# Patient Record
Sex: Female | Born: 1950 | Race: White | Hispanic: No | Marital: Married | Smoking: Current every day smoker
Health system: Southern US, Community
[De-identification: ages and names within clinical notes are randomized; demographics above are authoritative.]

## PROBLEM LIST (undated history)

## (undated) DIAGNOSIS — E079 Disorder of thyroid, unspecified: Secondary | ICD-10-CM

## (undated) DIAGNOSIS — E669 Obesity, unspecified: Secondary | ICD-10-CM

## (undated) DIAGNOSIS — O039 Complete or unspecified spontaneous abortion without complication: Secondary | ICD-10-CM

## (undated) DIAGNOSIS — Z1231 Encounter for screening mammogram for malignant neoplasm of breast: Secondary | ICD-10-CM

## (undated) DIAGNOSIS — H269 Unspecified cataract: Secondary | ICD-10-CM

## (undated) DIAGNOSIS — Z8541 Personal history of malignant neoplasm of cervix uteri: Secondary | ICD-10-CM

## (undated) DIAGNOSIS — Z720 Tobacco use: Secondary | ICD-10-CM

## (undated) DIAGNOSIS — Z8701 Personal history of pneumonia (recurrent): Secondary | ICD-10-CM

## (undated) DIAGNOSIS — C801 Malignant (primary) neoplasm, unspecified: Secondary | ICD-10-CM

## (undated) DIAGNOSIS — Z2820 Immunization not carried out because of patient decision for unspecified reason: Secondary | ICD-10-CM

## (undated) DIAGNOSIS — Z01419 Encounter for gynecological examination (general) (routine) without abnormal findings: Secondary | ICD-10-CM

## (undated) DIAGNOSIS — I8393 Asymptomatic varicose veins of bilateral lower extremities: Secondary | ICD-10-CM

## (undated) DIAGNOSIS — Z973 Presence of spectacles and contact lenses: Secondary | ICD-10-CM

## (undated) HISTORY — DX: Unspecified cataract: H26.9

## (undated) HISTORY — PX: WISDOM TOOTH EXTRACTION: SHX21

## (undated) HISTORY — PX: COLONOSCOPY: SHX174

## (undated) HISTORY — DX: Tobacco use: Z72.0

## (undated) HISTORY — DX: Personal history of malignant neoplasm of cervix uteri: Z85.41

## (undated) HISTORY — PX: SALPINGOSTOMY: SHX2372

## (undated) HISTORY — DX: Immunization not carried out because of patient decision for unspecified reason: Z28.20

## (undated) HISTORY — DX: Malignant (primary) neoplasm, unspecified: C80.1

## (undated) HISTORY — DX: Encounter for gynecological examination (general) (routine) without abnormal findings: Z01.419

## (undated) HISTORY — DX: Disorder of thyroid, unspecified: E07.9

## (undated) HISTORY — DX: Obesity, unspecified: E66.9

## (undated) HISTORY — DX: Complete or unspecified spontaneous abortion without complication: O03.9

## (undated) HISTORY — DX: Encounter for screening mammogram for malignant neoplasm of breast: Z12.31

## (undated) HISTORY — DX: Asymptomatic varicose veins of bilateral lower extremities: I83.93

## (undated) HISTORY — DX: Presence of spectacles and contact lenses: Z97.3

## (undated) HISTORY — DX: Personal history of pneumonia (recurrent): Z87.01

---

## 1982-06-13 DIAGNOSIS — Z8541 Personal history of malignant neoplasm of cervix uteri: Secondary | ICD-10-CM

## 1982-06-13 HISTORY — PX: COLPOSCOPY: SHX161

## 1982-06-13 HISTORY — DX: Personal history of malignant neoplasm of cervix uteri: Z85.41

## 2005-04-18 ENCOUNTER — Other Ambulatory Visit: Admission: RE | Admit: 2005-04-18 | Discharge: 2005-04-18 | Payer: Self-pay | Admitting: Family Medicine

## 2005-04-18 ENCOUNTER — Ambulatory Visit: Payer: Self-pay | Admitting: Family Medicine

## 2005-05-04 ENCOUNTER — Ambulatory Visit: Payer: Self-pay | Admitting: Family Medicine

## 2005-05-20 ENCOUNTER — Ambulatory Visit: Payer: Self-pay | Admitting: Family Medicine

## 2005-05-23 ENCOUNTER — Ambulatory Visit: Payer: Self-pay | Admitting: Family Medicine

## 2013-06-13 DIAGNOSIS — Z2820 Immunization not carried out because of patient decision for unspecified reason: Secondary | ICD-10-CM

## 2013-06-13 HISTORY — DX: Immunization not carried out because of patient decision for unspecified reason: Z28.20

## 2013-07-14 DIAGNOSIS — Z1231 Encounter for screening mammogram for malignant neoplasm of breast: Secondary | ICD-10-CM

## 2013-07-14 HISTORY — DX: Encounter for screening mammogram for malignant neoplasm of breast: Z12.31

## 2013-07-23 ENCOUNTER — Other Ambulatory Visit: Payer: Self-pay

## 2013-07-23 ENCOUNTER — Ambulatory Visit (INDEPENDENT_AMBULATORY_CARE_PROVIDER_SITE_OTHER): Payer: Federal, State, Local not specified - PPO | Admitting: Medical

## 2013-07-23 ENCOUNTER — Encounter: Payer: Self-pay | Admitting: Medical

## 2013-07-23 VITALS — BP 170/90 | HR 88 | Temp 98.1°F | Resp 18 | Wt 168.0 lb

## 2013-07-23 DIAGNOSIS — M79609 Pain in unspecified limb: Secondary | ICD-10-CM

## 2013-07-23 DIAGNOSIS — R221 Localized swelling, mass and lump, neck: Secondary | ICD-10-CM

## 2013-07-23 DIAGNOSIS — R030 Elevated blood-pressure reading, without diagnosis of hypertension: Secondary | ICD-10-CM

## 2013-07-23 DIAGNOSIS — M25531 Pain in right wrist: Secondary | ICD-10-CM

## 2013-07-23 DIAGNOSIS — M25539 Pain in unspecified wrist: Secondary | ICD-10-CM

## 2013-07-23 DIAGNOSIS — I8390 Asymptomatic varicose veins of unspecified lower extremity: Secondary | ICD-10-CM | POA: Insufficient documentation

## 2013-07-23 DIAGNOSIS — L989 Disorder of the skin and subcutaneous tissue, unspecified: Secondary | ICD-10-CM

## 2013-07-23 DIAGNOSIS — R220 Localized swelling, mass and lump, head: Secondary | ICD-10-CM

## 2013-07-23 DIAGNOSIS — M79644 Pain in right finger(s): Secondary | ICD-10-CM

## 2013-07-23 NOTE — Progress Notes (Signed)
Subjective:   Cheryl Contreras is a 63 y.o. female presenting on 07/23/2013 with RIGHT WRIST PAIN, VARICOSE VEINS and SKIN CANCER SPOTS  She reports not having any primary care or medical care at almost 9 years. She has several concerns today.  She notes that right hand doesn't work right anymore.   About a year ago started having problems with the right hand.  In the past did wood carving, but over the last year can't use the knives the same.  Has constant pain, but some days the pain is worse.  Seems to have less eversion, but no other changes in ROM.  No swelling.  Gets some tingling on the thumb, but no numbness.  At times feels less strong in the hand. Can't grip as strong as she use to. The problems seems to be the thumb, wrist and hand.    She has 2 skin findings that she wants looked at she is worried about these being cancer. She has a small area on the mid part of her nose that seems different than any other parts of her nose and has just continued to look the same way. She also has a larger skin lesion up under her left breast on her chest wall that has been there for several months but it is kind of big and she is worried about this as well  She has varicose veins that sometimes ache and give her tenderness.  Denies prior phlebitis, infection, bleeding.  She does not use compression hose, and says she probably would not use those regularly  She denies a history of high blood pressure although her pressure is elevated today. No chest pain, palpitations, swelling, vision changes.  No other complaint.  Review of Systems ROS as in subjective      Objective:    Filed Vitals:   07/23/13 1004  BP: 170/90  Pulse: 88  Temp: 98.1 F (36.7 C)  Resp: 18    General appearance: alert, no distress, WD/WN, white female Skin: right side of nose mid portion with 75mm round somewhat faint erythema slight pearly coloration different from surrounding tissue.  Left chest under breast with  linear 1.5 cm x 18mm raised brownish yellow stuck on appearing lesion suggestive of seborrheic keratosis Neck: Supple, nontender, no lymphadenopathy, no mass HEENT: right submandibular region with 60mm diameter roundish nontender lump unchanged for years per patient, but lump seems a bit different that other close by lymph nodes.   No induration, fluctuance, tenderness or warmth.  Lesion suggestive of cyst vs lymph node. Heart: RRR, normal S1, S2, no murmur Lungs clear Extremities neurovascularly intact Bilateral lower legs with moderate varicosities, tortuous varicosities, nontender, no obvious phlebitis or cords Pulses normal MSK: Tender over right wrist laterally and midline, mild tenderness base of right thumb, negative Finkelstein's, normal range of motion of wrist and fingers, otherwise nontender, no deformity, normal strength of the hand and wrist and fingers, left upper extremity unremarkable     Assessment: Encounter Diagnoses  Name Primary?  . Right wrist pain Yes  . Pain of right thumb   . Non-healing skin lesion of nose   . Skin lesion of chest wall   . Varicose vein of leg   . Submandibular swelling   . Elevated blood pressure reading without diagnosis of hypertension      Plan: 1. Right wrist pain  likely osteoarthritis, less likely carpal tunnel or tenosynovitis - DG Hand Complete Right; Future - DG Wrist Complete Right; Future  2. Pain of right thumb Likely osteoarthritis  3. Non-healing skin lesion of nose - Ambulatory referral to Dermatology  4. Skin lesion of chest wall Likely seborrheic keratosis, but she can discuss with dermatology as well as the nose lesion  5. Varicose vein of leg Discussed the diagnosis, possible complications. Advise she begin wearing compression hose however she says she will likely not do this. Advise routine exercise, begin 81 mg baby aspirin daily  6. Submandibular swelling I will call ENT to inquire about possible excision.  We  will call her back about this  7. Elevated blood pressure reading without diagnosis of hypertension F/u in 12mo  Return in about 1 month (around 08/20/2013), or CPX.

## 2013-07-24 ENCOUNTER — Telehealth: Payer: Self-pay | Admitting: Family Medicine

## 2013-07-24 NOTE — Telephone Encounter (Signed)
Patient is aware of her appointment at [facility] on 08/14/13 @ 950 am. CLS [phone]

## 2013-07-24 NOTE — Telephone Encounter (Signed)
Dr. Pollie Friar office does handle this issue, so I scheduled her appointment with him. 07/31/13 @ 900 am. CLS

## 2013-07-24 NOTE — Telephone Encounter (Signed)
Message copied by Armanda Magic on Wed Jul 24, 2013 11:29 AM ------      Message from: Carlena Hurl      Created: Tue Jul 23, 2013  9:30 PM       See other msg about calling ENT            Refer to dermatology for skin lesions nose and left chest wall ------

## 2013-07-24 NOTE — Telephone Encounter (Signed)
Message copied by Armanda Magic on Wed Jul 24, 2013  2:39 PM ------      Message from: Carlena Hurl      Created: Tue Jul 23, 2013  1:40 PM       Please call Dr. Mickie Hillier office       Address: [address], [place], [place] [address]      [phone]            This patient came in today for a lump right submandibular region. It  has been enlarged but unchanged for years apparently, but its small roughly 5-6 mm diameter.  Hard to tell today if it is a lymph node versus a cyst.  Either way, this patient would potentially like to have this cut out.              So my question is does Dr. Lucia Gaskins potentially excise cysts in the submandibular region, would we need to get imaging such as ultrasound first, or would this be a general surgery issue?   ------

## 2013-07-25 ENCOUNTER — Ambulatory Visit
Admission: RE | Admit: 2013-07-25 | Discharge: 2013-07-25 | Disposition: A | Discharge status: Home / Self Care | Payer: Federal, State, Local not specified - PPO | Source: Ambulatory Visit | Attending: Medical | Admitting: Medical

## 2013-07-25 ENCOUNTER — Other Ambulatory Visit: Payer: Self-pay | Admitting: Medical

## 2013-07-25 ENCOUNTER — Ambulatory Visit
Admission: RE | Admit: 2013-07-25 | Discharge: 2013-07-25 | Disposition: A | Discharge status: Home / Self Care | Payer: Self-pay | Source: Ambulatory Visit | Attending: Medical | Admitting: Medical

## 2013-07-25 DIAGNOSIS — M25531 Pain in right wrist: Secondary | ICD-10-CM

## 2013-07-26 ENCOUNTER — Encounter: Payer: Self-pay | Admitting: Medical

## 2013-08-07 ENCOUNTER — Ambulatory Visit (INDEPENDENT_AMBULATORY_CARE_PROVIDER_SITE_OTHER): Payer: Federal, State, Local not specified - PPO | Admitting: Medical

## 2013-08-07 ENCOUNTER — Other Ambulatory Visit (HOSPITAL_COMMUNITY)
Admission: RE | Admit: 2013-08-07 | Discharge: 2013-08-07 | Disposition: A | Discharge status: Home / Self Care | Payer: Federal, State, Local not specified - PPO | Source: Ambulatory Visit | Attending: Family Medicine | Admitting: Family Medicine

## 2013-08-07 ENCOUNTER — Encounter: Payer: Self-pay | Admitting: Medical

## 2013-08-07 VITALS — BP 118/78 | HR 96 | Temp 98.3°F | Resp 18 | Ht 61.25 in | Wt 169.0 lb

## 2013-08-07 DIAGNOSIS — F17200 Nicotine dependence, unspecified, uncomplicated: Secondary | ICD-10-CM

## 2013-08-07 DIAGNOSIS — Z2820 Immunization not carried out because of patient decision for unspecified reason: Secondary | ICD-10-CM | POA: Insufficient documentation

## 2013-08-07 DIAGNOSIS — Z01419 Encounter for gynecological examination (general) (routine) without abnormal findings: Secondary | ICD-10-CM | POA: Insufficient documentation

## 2013-08-07 DIAGNOSIS — Z23 Encounter for immunization: Secondary | ICD-10-CM

## 2013-08-07 DIAGNOSIS — I8393 Asymptomatic varicose veins of bilateral lower extremities: Secondary | ICD-10-CM

## 2013-08-07 DIAGNOSIS — Z1239 Encounter for other screening for malignant neoplasm of breast: Secondary | ICD-10-CM

## 2013-08-07 DIAGNOSIS — Z1211 Encounter for screening for malignant neoplasm of colon: Secondary | ICD-10-CM

## 2013-08-07 DIAGNOSIS — Z124 Encounter for screening for malignant neoplasm of cervix: Secondary | ICD-10-CM

## 2013-08-07 DIAGNOSIS — F1010 Alcohol abuse, uncomplicated: Secondary | ICD-10-CM

## 2013-08-07 DIAGNOSIS — I8390 Asymptomatic varicose veins of unspecified lower extremity: Secondary | ICD-10-CM

## 2013-08-07 DIAGNOSIS — Z0000 Encounter for general adult medical examination without abnormal findings: Secondary | ICD-10-CM

## 2013-08-07 DIAGNOSIS — L989 Disorder of the skin and subcutaneous tissue, unspecified: Secondary | ICD-10-CM

## 2013-08-07 DIAGNOSIS — Z8541 Personal history of malignant neoplasm of cervix uteri: Secondary | ICD-10-CM

## 2013-08-07 DIAGNOSIS — R221 Localized swelling, mass and lump, neck: Secondary | ICD-10-CM

## 2013-08-07 DIAGNOSIS — R220 Localized swelling, mass and lump, head: Secondary | ICD-10-CM

## 2013-08-07 DIAGNOSIS — Z2889 Immunization not carried out for other reason: Secondary | ICD-10-CM

## 2013-08-07 LAB — COMPREHENSIVE METABOLIC PANEL
ALT: 20 U/L (ref 0–35)
AST: 20 U/L (ref 0–37)
Albumin: 4.5 g/dL (ref 3.5–5.2)
Alkaline Phosphatase: 61 U/L (ref 39–117)
BUN: 10 mg/dL (ref 6–23)
CALCIUM: 10 mg/dL (ref 8.4–10.5)
CO2: 27 meq/L (ref 19–32)
Chloride: 102 mEq/L (ref 96–112)
Creat: 0.62 mg/dL (ref 0.50–1.10)
GLUCOSE: 94 mg/dL (ref 70–99)
POTASSIUM: 5.7 meq/L — AB (ref 3.5–5.3)
SODIUM: 140 meq/L (ref 135–145)
TOTAL PROTEIN: 7.9 g/dL (ref 6.0–8.3)
Total Bilirubin: 0.5 mg/dL (ref 0.2–1.2)

## 2013-08-07 LAB — CBC
HCT: 50.4 % — ABNORMAL HIGH (ref 36.0–46.0)
HEMOGLOBIN: 17.3 g/dL — AB (ref 12.0–15.0)
MCH: 33.3 pg (ref 26.0–34.0)
MCHC: 34.3 g/dL (ref 30.0–36.0)
MCV: 96.9 fL (ref 78.0–100.0)
PLATELETS: 259 10*3/uL (ref 150–400)
RBC: 5.2 MIL/uL — AB (ref 3.87–5.11)
RDW: 14 % (ref 11.5–15.5)
WBC: 6.5 10*3/uL (ref 4.0–10.5)

## 2013-08-07 LAB — POCT URINALYSIS DIPSTICK
BILIRUBIN UA: NEGATIVE
Blood, UA: NEGATIVE
GLUCOSE UA: NEGATIVE
Ketones, UA: NEGATIVE
Leukocytes, UA: NEGATIVE
Nitrite, UA: NEGATIVE
Protein, UA: NEGATIVE
Spec Grav, UA: <=1.005
UROBILINOGEN UA: NEGATIVE
pH, UA: 6

## 2013-08-07 LAB — LIPID PANEL
CHOLESTEROL: 249 mg/dL — AB (ref 0–200)
HDL: 80 mg/dL (ref >39)
LDL Cholesterol: 154 mg/dL — ABNORMAL HIGH (ref 0–99)
Total CHOL/HDL Ratio: 3.1 Ratio
Triglycerides: 73 mg/dL (ref <150)
VLDL: 15 mg/dL (ref 0–40)

## 2013-08-07 LAB — TSH: TSH: 2.272 u[IU]/mL (ref 0.350–4.500)

## 2013-08-07 NOTE — Progress Notes (Addendum)
Subjective:   HPI  Cheryl Contreras is a 63 y.o. female who presents for a complete physical.  Recently established as a new patient.  No routine physical or cancer screening in years.  Preventative care: 2006 Last ophthalmology visit: last month Last dental visit: 4 years Last colonoscopy: no Last mammogram: never Last gynecological exam: 2006 Last EKG: never  Last labs: 2006  Prior vaccinations: TD or Tdap: 2006 Influenza: no Pneumococcal: no Shingles/Zostavax: no  Advanced directive: Health care power of attorney: no Living will: no   Past Medical History  Diagnosis Date  . History of cervical cancer 1984  . Wears glasses   . History of pneumonia   . Tobacco use   . Varicose veins of legs   . Miscarriage     hospitalized, remote past  . Vaccine refused by patient 2015    pneumococcal and shingles    Past Surgical History  Procedure Laterality Date  . Wisdom tooth extraction    . Salpingostomy    . Colposcopy  1984  . Colonoscopy      never    History   Social History  . Marital Status: Married    Spouse Name: N/A    Number of Children: N/A  . Years of Education: N/A   Occupational History  . Not on file.   Social History Main Topics  . Smoking status: Current Every Day Smoker -- 1.00 packs/day for 34 years  . Smokeless tobacco: Not on file  . Alcohol Use: 25.2 oz/week    42 Cans of beer per week  . Drug Use: No  . Sexual Activity: Not on file   Other Topics Concern  . Not on file   Social History Narrative   Widowed, husband passed 2009, lives alone, exercise on the job, works at the [employer]    No family history on file.  No current outpatient prescriptions on file.  Allergies  Allergen Reactions  . Shellfish Allergy     Reviewed their medical, surgical, family, social, medication, and allergy history and updated chart as appropriate.   Review of Systems Constitutional: -fever, -chills, -sweats, -unexpected weight change,  -decreased appetite, -fatigue Allergy: -sneezing, -itching, -congestion Dermatology: -changing moles, --rash, -lumps ENT: -runny nose, -ear pain, -sore throat, -hoarseness, -sinus pain, -teeth pain, - ringing in ears, -hearing loss, -nosebleeds Cardiology: -chest pain, -palpitations, -swelling, -difficulty breathing when lying flat, -waking up short of breath Respiratory: -cough, -shortness of breath, -difficulty breathing with exercise or exertion, -wheezing, -coughing up blood Gastroenterology: -abdominal pain, -nausea, -vomiting, -diarrhea, -constipation, -blood in stool, -changes in bowel movement, -difficulty swallowing or eating Hematology: -bleeding, -bruising  Musculoskeletal: -joint aches, -muscle aches, -joint swelling, -back pain, -neck pain, -cramping, -changes in gait Ophthalmology: denies vision changes, eye redness, itching, discharge Urology: -burning with urination, -difficulty urinating, -blood in urine, -urinary frequency, -urgency, -incontinence Neurology: -headache, -weakness, -tingling, -numbness, -memory loss, -falls, -dizziness Psychology: -depressed mood, -agitation, -sleep problems     Objective:   Physical Exam  BP 118/78  Pulse 96  Temp(Src) 98.3 F (36.8 C) (Oral)  Resp 18  Ht 5' 1.25" (1.556 m)  Wt 169 lb (76.658 kg)  BMI 31.66 kg/m2  General appearance: alert, no distress, WD/WN, obese white female Skin: right side of nose mid portion with 38mm round somewhat faint erythema slight pearly coloration different from surrounding tissue. Left chest under breast with linear 1.5 cm x 56mm raised brownish yellow stuck on appearing lesion suggestive of seborrheic keratosis, scattered benign appearing macules  including face, torso, extremities, no other worrisome lesions HEENT: right submandibular region with 45mm diameter roundish nontender lump unchanged for years per patient, but lump seems a bit different that other close by lymph nodes. No induration, fluctuance,  tenderness or warmth. Lesion suggestive of cyst vs lymph node.  Otherwise, normocephalic, conjunctiva/corneas normal, sclerae anicteric, PERRLA, EOMi, nares patent, no discharge or erythema, pharynx normal Oral cavity: MMM, tongue normal, teeth - left lower molar with decay, plaque present, otherwise teeth in good repair Neck: supple, no lymphadenopathy, no thyromegaly, no masses, normal ROM, no bruits Chest: non tender, normal shape and expansion Heart: RRR, normal S1, S2, no murmurs Lungs: CTA bilaterally, no wheezes, rhonchi, or rales Abdomen: +bs, soft, non tender, non distended, no masses, no hepatomegaly, no splenomegaly, no bruits Back: non tender, normal ROM, no scoliosis Musculoskeletal: upper extremities non tender, no obvious deformity, normal ROM throughout, lower extremities non tender, no obvious deformity, normal ROM throughout Extremities: moderate bilat lower leg anterior and posterior varicosities, tortuous, no edema, no cyanosis, no clubbing Pulses: 2+ symmetric, upper and lower extremities, normal cap refill Neurological: alert, oriented x 3, CN2-12 intact, strength normal upper extremities and lower extremities, sensation normal throughout, DTRs 2+ throughout, no cerebellar signs, gait normal Psychiatric: normal affect, behavior normal, pleasant  Breast: nontender, no masses or lumps, bilat lateral lower quadrant bilat with some increased patchy erythema suggestive of superficial capillary flushing, but  No other skin changes, no nipple discharge or inversion, no axillary lymphadenopathy Gyn: vulvar with age related atrophic changes, otherwise normal external genitalia without lesions, vagina with normal mucosa, cervix with right lower round white scar from proir colposcopy, otherwise without lesions, no cervical motion tenderness, no abnormal vaginal discharge.  Uterus and adnexa not enlarged, nontender, no masses.  Pap performed.  Exam chaperoned by nurse. Rectal: anus  nontneder, normal tone, occult negative stool    Assessment and Plan :    Encounter Diagnoses  Name Primary?  . Routine general medical examination at a health care facility Yes  . Varicose veins of legs   . Swelling, mass, or lump in head and neck   . Unspecified disorder of skin and subcutaneous tissue   . Tobacco use disorder   . Screening for breast cancer   . History of cervical cancer   . Need for Tdap vaccination   . Vaccine refused by patient   . Screening for cervical cancer   . Screen for colon cancer   . Excessive drinking alcohol       Physical exam - discussed healthy lifestyle, diet, exercise, preventative care, vaccinations, and addressed their concerns.  Handout given.  Specific recommendations today include:  I recommend you exercise regularly, eat a healthy diet  I recommend you stop smoking  We will refer you for screening mammogram  We are checking labs and Pap smear today.  We will call with those results  I recommend you have a screening colonoscopy.  This is done through gastroenterology to screening for colon cancer.  This is typically done starting at age 75  We updated your Tdap vaccine today  I recommend you consider the pneumococcal and shingles vaccine.  I recommend you take 600 units of vitamin D daily over-the-counter  I recommend you take 1200 mg of calcium daily over-the-counter, or consume at least 4 servings of dairy per day  Consider taking a multivitamin daily  I recommend you take a baby aspirin 81 mg daily over-the-counter  Please go ahead and check your insurance  coverage for a bone density scan for osteoporosis screening  If you would like a prescription for compression hose waist high, then let me know  See eye doctor and dentist yearly  Return pending labs.

## 2013-08-07 NOTE — Patient Instructions (Signed)
  Thank you for giving me the opportunity to serve you today.    Your diagnosis today includes: Encounter Diagnoses  Name Primary?  . Routine general medical examination at a health care facility Yes  . Varicose veins of legs   . Swelling, mass, or lump in head and neck   . Unspecified disorder of skin and subcutaneous tissue   . Tobacco use disorder   . Screening for breast cancer   . History of cervical cancer   . Need for Tdap vaccination   . Vaccine refused by patient   . Screening for cervical cancer   . Screen for colon cancer      Specific recommendations today include:  I recommend you exercise regularly, eat a healthy diet  I recommend you stop smoking  We will refer you for screening mammogram  We are checking labs and Pap smear today.  We will call with those results  I recommend you have a screening colonoscopy.  This is done through gastroenterology to screening for colon cancer.  This is typically done starting at age 84  We updated your Tdap vaccine today  I recommend you consider the pneumococcal and shingles vaccine.  I recommend you take 600 units of vitamin D daily over-the-counter  I recommend you take 1200 mg of calcium daily over-the-counter, or consume at least 4 servings of dairy per day  Consider taking a multivitamin daily  I recommend you take a baby aspirin 81 mg daily over-the-counter  Please go ahead and check your insurance coverage for a bone density scan for osteoporosis screening  If you would like a prescription for compression hose waist high, then let me know  Follow up: pending labs, referrals   I have included other useful information below for your review.  Please try and quit smoking--start thinking about why/when you smoke (habit, boredom, stress) in order to come up with effective strategies to cut back or quit. Available resources to help you quit include free counseling through [facility] ([facility]uit[facility] or  1-800-QUITNOW), smoking cessation classes through [facility] (call to find out schedule), over-the-counter nicotine replacements, and e-cigarettes (although this may not help break the hand-mouth habit).  Many insurance companies also have smoking cessation programs (which may decrease the cost of patches, meds if enrolled).  If these methods are not effective for you, and you are motivated to quit, return to discuss the possibility of prescription medications.

## 2013-08-12 ENCOUNTER — Other Ambulatory Visit: Payer: Self-pay | Admitting: Family Medicine

## 2013-08-12 DIAGNOSIS — Z1231 Encounter for screening mammogram for malignant neoplasm of breast: Secondary | ICD-10-CM

## 2013-09-05 ENCOUNTER — Other Ambulatory Visit: Payer: Self-pay | Admitting: Otolaryngology

## 2013-09-27 ENCOUNTER — Ambulatory Visit
Admission: RE | Admit: 2013-09-27 | Discharge: 2013-09-27 | Disposition: A | Discharge status: Home / Self Care | Payer: Federal, State, Local not specified - PPO | Source: Ambulatory Visit | Attending: Medical | Admitting: Medical

## 2013-09-27 DIAGNOSIS — Z1231 Encounter for screening mammogram for malignant neoplasm of breast: Secondary | ICD-10-CM

## 2013-09-30 ENCOUNTER — Encounter: Payer: Self-pay | Admitting: Family Medicine

## 2020-02-07 ENCOUNTER — Other Ambulatory Visit: Payer: Self-pay | Admitting: Internal Medicine

## 2020-02-07 DIAGNOSIS — Z1231 Encounter for screening mammogram for malignant neoplasm of breast: Secondary | ICD-10-CM

## 2020-02-07 DIAGNOSIS — R5381 Other malaise: Secondary | ICD-10-CM

## 2020-02-13 ENCOUNTER — Other Ambulatory Visit: Payer: Self-pay | Admitting: Family Medicine

## 2020-02-13 ENCOUNTER — Other Ambulatory Visit: Payer: Self-pay | Admitting: Internal Medicine

## 2020-02-13 DIAGNOSIS — Z13820 Encounter for screening for osteoporosis: Secondary | ICD-10-CM

## 2020-02-27 ENCOUNTER — Other Ambulatory Visit: Payer: Self-pay | Admitting: Family Medicine

## 2020-02-27 DIAGNOSIS — M79659 Pain in unspecified thigh: Secondary | ICD-10-CM

## 2020-02-27 DIAGNOSIS — M5416 Radiculopathy, lumbar region: Secondary | ICD-10-CM

## 2020-02-27 DIAGNOSIS — I739 Peripheral vascular disease, unspecified: Secondary | ICD-10-CM

## 2020-03-20 ENCOUNTER — Ambulatory Visit
Admission: RE | Admit: 2020-03-20 | Discharge: 2020-03-20 | Disposition: A | Discharge status: Home / Self Care | Payer: Medicare Other | Source: Ambulatory Visit | Attending: Family Medicine | Admitting: Family Medicine

## 2020-03-20 ENCOUNTER — Other Ambulatory Visit: Payer: Self-pay | Admitting: Family Medicine

## 2020-03-20 ENCOUNTER — Other Ambulatory Visit: Payer: Self-pay

## 2020-03-20 DIAGNOSIS — M5416 Radiculopathy, lumbar region: Secondary | ICD-10-CM

## 2020-03-20 DIAGNOSIS — I739 Peripheral vascular disease, unspecified: Secondary | ICD-10-CM

## 2020-03-20 DIAGNOSIS — M79659 Pain in unspecified thigh: Secondary | ICD-10-CM

## 2020-03-20 MED ORDER — GADOBENATE DIMEGLUMINE 529 MG/ML IV SOLN
20.0000 mL | Freq: Once | INTRAVENOUS | Status: AC | PRN
  Administered 2020-03-20: 20 mL via INTRAVENOUS

## 2020-05-12 ENCOUNTER — Ambulatory Visit (INDEPENDENT_AMBULATORY_CARE_PROVIDER_SITE_OTHER): Payer: Medicare Other | Admitting: Orthopaedic Surgery

## 2020-05-12 ENCOUNTER — Encounter: Payer: Self-pay | Admitting: Orthopaedic Surgery

## 2020-05-12 ENCOUNTER — Ambulatory Visit: Payer: Self-pay

## 2020-05-12 VITALS — Ht 62.0 in | Wt 190.0 lb

## 2020-05-12 DIAGNOSIS — M545 Low back pain, unspecified: Secondary | ICD-10-CM | POA: Diagnosis not present

## 2020-05-12 DIAGNOSIS — M7061 Trochanteric bursitis, right hip: Secondary | ICD-10-CM

## 2020-05-12 DIAGNOSIS — M48061 Spinal stenosis, lumbar region without neurogenic claudication: Secondary | ICD-10-CM | POA: Diagnosis not present

## 2020-05-12 DIAGNOSIS — M7062 Trochanteric bursitis, left hip: Secondary | ICD-10-CM

## 2020-05-12 DIAGNOSIS — M4316 Spondylolisthesis, lumbar region: Secondary | ICD-10-CM

## 2020-05-12 MED ORDER — BUPIVACAINE HCL 0.25 % IJ SOLN
6.0000 mL | INTRAMUSCULAR | Status: AC | PRN
  Administered 2020-05-12: 6 mL via INTRA_ARTICULAR

## 2020-05-12 MED ORDER — LIDOCAINE HCL 1 % IJ SOLN
3.0000 mL | INTRAMUSCULAR | Status: AC | PRN
  Administered 2020-05-12: 3 mL

## 2020-05-12 NOTE — Progress Notes (Signed)
Office Visit Note   Patient: Cheryl Contreras           Date of Birth: 1951-04-04           MRN: [id number] Visit Date: 05/12/2020              Requested by: Buzzy Han, MD [place],  [place] [address] PCP: Buzzy Han, MD   Assessment & Plan: Visit Diagnoses:  1. Acute bilateral low back pain, unspecified whether sciatica present   2. Spondylolisthesis, lumbar region   3. Spinal stenosis of lumbar region, unspecified whether neurogenic claudication present   4. Trochanteric bursitis, left hip   5. Trochanteric bursitis, right hip     Plan: I reviewed lumbar MRI report with patient as well as lumbar spine x-rays that were done today.  She does have a grade 1 L4-5 spondylolisthesis.  Advised patient that I do not think that this is an acute problem there but was likely flared up when she fell in August.  She states that most of her pain is centered around the left hip greater trochanter bursa and this pain extends down the course of the IT band to the knee.  Offered conservative treatment of this area with left hip greater trochanter bursa Marcaine/Depo-Medrol injection.  After patient consent lateral hip was prepped with Betadine and injection was performed.  After sitting for a few minutes patient reported very good relief of her left lateral hip pain with anesthetic in place.  I will have her follow-up with Dr. Lorin Mercy in 4 weeks for recheck to see how she is feeling.  If her back still continues to be an issue I did discuss that he may consider conservative management with L4-5 facet injections versus transforaminal.  I did discuss that at some point it may come down her needing surgical intervention with lumbar fusion but I do not think that this is indicated at this point and she also was not interested in any surgical procedures.  In Regards to her findings on MR angiogram abdomen that was done March 21, 2020 advised her that she should speak with  her primary care physician who ordered the study and make sure that she gets appropriate referral to vascular surgery for evaluation.  She does describe having some claudication symptoms and some of this is likely neurogenic but arterial may also be contributing.  Also did advise patient before she left that if the right hip was becoming more of a problem that she can come back and see me in 1 week for right greater trochanter bursa injection and then I will have her follow-up with Dr. Lorin Mercy at her regular scheduled appointment.  All questions answered.  Follow-Up Instructions: Return in about 4 weeks (around 06/09/2020) for with dr yates recheck lumbar and left hip.  .   Orders:  Orders Placed This Encounter  Procedures  . Large Joint Inj  . XR Lumbar Spine Complete   No orders of the defined types were placed in this encounter.     Procedures: Large Joint Inj: L greater trochanter on 05/12/2020 11:02 AM Details: 22 G 3.5 in needle, lateral approach Medications: 3 mL lidocaine 1 %; 6 mL bupivacaine 0.25 % Outcome: tolerated well, no immediate complications  1 cc of betamethasone was used with 6 cc of Marcaine Consent was given by the patient. Patient was prepped and draped in the usual sterile fashion.       Clinical Data: No additional findings.   Subjective:  Chief Complaint  Patient presents with  . Lower Back - Pain    HPI 69 year old white female who is new patient to clinic comes in with complaints of low back pain and bilateral leg pain.  Patient states that she was doing fine up until a fall where she slipped on wet grass and landed directly onto her buttocks and August 2021.  She has been followed by her primary care provider for this.  She is had conservative treatment with prednisone taper and this gave temporary improvement.  She describes pain more going into the bilateral buttocks and this also extends down the left greater than right lateral hips to her knees.   Intermittent numbness and tingling in the legs at night.  She states that over the last 6 months to a year her walking distances have somewhat decreased due to legs getting tired.  Lumbar MRI and MR angiogram abdomen  performed October 2021.  Lumbar showed:  EXAM: MRI LUMBAR SPINE WITHOUT CONTRAST  TECHNIQUE: Multiplanar, multisequence MR imaging of the lumbar spine was performed. No intravenous contrast was administered.  COMPARISON:  Radiography same day  FINDINGS: Segmentation:  5 lumbar type vertebral bodies.  Alignment:  Degenerative anterolisthesis at L4-5 of 4 mm.  Vertebrae:  No fracture or primary bone lesion.  Conus medullaris and cauda equina: Conus extends to the L1-2 level. Conus and cauda equina appear normal.  Paraspinal and other soft tissues: Negative  Disc levels:  Mild non-compressive disc bulges from T11-12 through L1-2.  L2-3 and L3-4: Normal  L4-5: Chronic facet arthropathy allowing 4 mm of anterolisthesis. Degeneration and bulging of the disc. Moderate multifactorial stenosis at this level that could cause neural compression. Facet arthropathy could be a cause of back pain or referred facet syndrome pain.  L5-S1: Normal appearance of the disc. Facet osteoarthritis left more than right. No compressive stenosis. Facet arthritis could be symptomatic.  IMPRESSION: L4-5: Moderate multifactorial spinal stenosis. Advanced facet arthropathy with 4 mm of anterolisthesis. Bulging of the disc. Neural compression could occur at this level. Facet arthropathy could be painful.  L5-S1: No stenosis. Facet osteoarthritis left more than right could contribute to back pain.   Electronically Signed   By: Nelson Chimes M.D.   On: 03/20/2020 13:14   MRA abdomen showed:  CLINICAL DATA:  Bilateral leg pain, claudication, PVD  EXAM: MRA ABDOMEN AND PELVIS WITH CONTRAST  TECHNIQUE: Multiplanar, multiecho pulse sequences of the abdomen and  pelvis were obtained with intravenous contrast. Angiographic images of abdomen and pelvis were obtained using MRA technique with intravenous contrast.  CONTRAST:  60mL MULTIHANCE GADOBENATE DIMEGLUMINE 529 MG/ML IV SOLN  COMPARISON:  None.  FINDINGS: ARTERIAL  Aorta: Minimal atheromatous irregularity in the infrarenal segment. No aneurysm, dissection, or stenosis.  Celiac axis:          Patent  Superior mesenteric:  Patent, with classic distal branch anatomy.  Left renal: Single, patent proximally, with some motion degradation distally  Right renal: Single, patent to the hilum. Motion degrades branch evaluation  Inferior mesenteric:  Patent  Left iliac: Common and external iliac arteries widely patent. Origin stenosis of the internal iliac artery.  Right iliac: Common and external iliac arteries widely patent. Short-segment origin stenosis or occlusion of internal iliac artery, patent distally.  Visualized outflow: Patent bilateral common femoral arteries, deep femoral branches, and proximal SFAs.  VENOUS  Common femoral veins, iliac venous system, IVC, and renal veins unremarkable. SMV and portal vein patent. No venous pathology identified.  NONVASCULAR  Hepatobiliary: Visualized portions unremarkable.  Pancreas: Negative limited evaluation  Spleen: Incompletely visualized  Adrenals/Urinary Tract: No mass or hydronephrosis. Urinary bladder incompletely distended.  Stomach/Bowel: No evidence of obstruction, inflammatory process, or abnormal fluid collections.  Lymphatic: No pathologically enlarged lymph nodes.  Reproductive: Mildly enlarged heterogenous uterus suggesting fibroids. No adnexal mass.  Other: No ascites.  Musculoskeletal: No suspicious bone lesions identified.  IMPRESSION: 1. Aortic Atherosclerosis (ICD10-170.0) without significant aortoiliac occlusive disease. 2. 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 3. Probable uterine fibroids.   Electronically Signed   By: Lucrezia Europe M.D.   On: 03/21/2020 10:23  Patient was told that she had lumbar stenosis but was not really sure what that meant.  She is also advised that she has peripheral vascular disease but states that she has not had referral to vascular surgery as of yet.  Review of Systems No current cardiac pulmonary GI GU issues  Objective: Vital Signs: Ht 5\' 2"  (1.575 m)   Wt 190 lb (86.2 kg)   BMI 34.75 kg/m   Physical Exam HENT:     Head: Normocephalic.  Pulmonary:     Effort: No respiratory distress.  Musculoskeletal:     Comments: Patient has a Trendelenburg gait.  Marked tenderness over the left hip greater trochanter bursa and less tender on the right side.  Lower lumbar paraspinal tenderness.  Negative logroll bilateral hips.  Negative straight leg raise.  No focal motor deficits.  Neurological:     General: No focal deficit present.     Mental Status: She is alert and oriented to person, place, and time.     Ortho Exam  Specialty Comments:  No specialty comments available.  Imaging: No results found.   PMFS History: Patient Active Problem List   Diagnosis Date Noted  . Varicose veins of legs 08/07/2013  . Tobacco use disorder 08/07/2013  . History of cervical cancer 08/07/2013  . Excessive drinking alcohol 08/07/2013  . Vaccine refused by patient 08/07/2013  . Varicose vein of leg 07/23/2013   Past Medical History:  Diagnosis Date  . Encounter for mammogram to establish baseline mammogram 07/2013   referral for first screening mammogram age 82yo  . History of cervical cancer 1984  . History of pneumonia   . Miscarriage    hospitalized, remote past  . Obesity   . Routine gynecological examination    updated pap 07/2013 after many years of no routine f/u  . Tobacco use   . Vaccine refused by patient 2015   pneumococcal and shingles  . Varicose  veins of legs   . Wears glasses     No family history on file.  Past Surgical History:  Procedure Laterality Date  . COLONOSCOPY     never  . COLPOSCOPY  1984  . SALPINGOSTOMY    . WISDOM TOOTH EXTRACTION     Social History   Occupational History  . Not on file  Tobacco Use  . Smoking status: Current Every Day Smoker    Packs/day: 1.00    Years: 34.00    Pack years: 34.00  . Smokeless tobacco: Never Used  Substance and Sexual Activity  . Alcohol use: Yes    Alcohol/week: 42.0 standard drinks    Types: 42 Cans of beer per week  . Drug use: No  . Sexual activity: Not on file

## 2020-06-23 ENCOUNTER — Other Ambulatory Visit: Payer: Federal, State, Local not specified - PPO

## 2020-06-23 ENCOUNTER — Ambulatory Visit: Payer: Federal, State, Local not specified - PPO

## 2020-08-03 ENCOUNTER — Other Ambulatory Visit: Payer: Self-pay

## 2020-08-03 ENCOUNTER — Ambulatory Visit
Admission: RE | Admit: 2020-08-03 | Discharge: 2020-08-03 | Disposition: A | Discharge status: Home / Self Care | Payer: Medicare Other | Source: Ambulatory Visit | Attending: Internal Medicine | Admitting: Internal Medicine

## 2020-08-03 DIAGNOSIS — Z1231 Encounter for screening mammogram for malignant neoplasm of breast: Secondary | ICD-10-CM

## 2020-10-14 ENCOUNTER — Encounter: Payer: Self-pay | Admitting: Nurse Practitioner

## 2020-11-04 ENCOUNTER — Other Ambulatory Visit: Payer: Self-pay

## 2020-11-04 ENCOUNTER — Encounter: Payer: Self-pay | Admitting: Nurse Practitioner

## 2020-11-04 ENCOUNTER — Ambulatory Visit (INDEPENDENT_AMBULATORY_CARE_PROVIDER_SITE_OTHER): Payer: Medicare Other | Admitting: Nurse Practitioner

## 2020-11-04 VITALS — BP 122/70 | HR 88 | Ht 62.0 in | Wt 200.0 lb

## 2020-11-04 DIAGNOSIS — R195 Other fecal abnormalities: Secondary | ICD-10-CM | POA: Diagnosis not present

## 2020-11-04 MED ORDER — NA SULFATE-K SULFATE-MG SULF 17.5-3.13-1.6 GM/177ML PO SOLN: 1.0000 | Freq: Once | ORAL | 0 refills | Status: AC

## 2020-11-04 NOTE — Progress Notes (Signed)
I agree with the above note, plan 

## 2020-11-04 NOTE — Progress Notes (Signed)
ASSESSMENT AND PLAN    # 70 yo female with positive FIT.  No overt GI bleeding or other concerning symptoms.  No [place] of colon cancer.  -- Patient will be scheduled for colonoscopy the risks and benefits of colonoscopy with possible polypectomy / biopsies were discussed and the patient agrees to proceed.    HISTORY OF PRESENT ILLNESS     Chief Complaint : positive FIT test  Cheryl Contreras is a 70 y.o. female with a past medical history significant for cervical cancer, obesity, tobacco use, iliac artery stenosis. See additional PMH below.   Patient is new to the practice, referred by PCP for evaluation of a positive FIT.  Patient says she rarely sees a healthcare provider but pain in left hip prompted a visit to PCP. Subsequently diagnosed with trochanteric bursitis of left > right hip, lumbar spinal stenosis,spondylolisthesis. Treated with injection and gabapentin which did not help.    Patient denies overt GI bleeding.  Her bowel movements have been at baseline.  No abdominal pain unexplained weight loss or other GI symptoms.  No family history of colon cancer.  Patient says the FIT was her first colon cancer screening study   Data Reviewed: January 2022  TSH 2.7 Hemoglobin A1c 5.8   PREVIOUS EVALUATIONS:   NONE    Past Medical History:  Diagnosis Date  . Encounter for mammogram to establish baseline mammogram 07/2013   referral for first screening mammogram age 34yo  . History of cervical cancer 1984  . History of pneumonia   . Miscarriage    hospitalized, remote past  . Obesity   . Routine gynecological examination    updated pap 07/2013 after many years of no routine f/u  . Tobacco use   . Vaccine refused by patient 2015   pneumococcal and shingles  . Varicose veins of legs   . Wears glasses      Past Surgical History:  Procedure Laterality Date  . COLONOSCOPY     never  . COLPOSCOPY  1984  . SALPINGOSTOMY    . WISDOM TOOTH EXTRACTION     No family  history on file. Social History   Tobacco Use  . Smoking status: Current Every Day Smoker    Packs/day: 1.00    Years: 34.00    Pack years: 34.00  . Smokeless tobacco: Never Used  Substance Use Topics  . Alcohol use: Yes    Alcohol/week: 42.0 standard drinks    Types: 42 Cans of beer per week  . Drug use: No   Current Outpatient Medications  Medication Sig Dispense Refill  . gabapentin (NEURONTIN) 300 MG capsule Take 300 mg by mouth 3 (three) times daily.    Marland Kitchen levothyroxine (SYNTHROID) 25 MCG tablet Take 25 mcg by mouth daily.     No current facility-administered medications for this visit.   Allergies  Allergen Reactions  . Shellfish Allergy      Review of Systems:  All systems reviewed and negative except where noted in HPI.    PHYSICAL EXAM :    Wt Readings from Last 3 Encounters:  05/12/20 190 lb (86.2 kg)  08/07/13 169 lb (76.7 kg)  07/23/13 168 lb (76.2 kg)    BP 122/70   Pulse 88   Ht 5\' 2"  (1.575 m)   Wt 200 lb (90.7 kg)   SpO2 98%   BMI 36.58 kg/m  Constitutional:  Pleasant female in no acute distress. Psychiatric: Cooperative, flat affect.  EENT: Pupils normal.  Conjunctivae are normal. No scleral icterus. Neck supple.  Cardiovascular: Normal rate, regular rhythm. No edema Pulmonary/chest: Effort normal and breath sounds normal. No wheezing, rales or rhonchi. Abdominal: Soft, nondistended, nontender. Bowel sounds active throughout. There are no masses palpable. No hepatomegaly. Neurological: Alert and oriented to person place and time. Skin: Skin is warm and dry. No rashes noted.  Tye Savoy, NP  11/04/2020, 8:32 AM  Cc:  Referring Provider Buzzy Han*

## 2020-11-04 NOTE — Patient Instructions (Signed)
If you are age 69 or older, your body mass index should be between 23-30. Your Body mass index is 36.58 kg/m. If this is out of the aforementioned range listed, please consider follow up with your Primary Care Provider.  If you are age 43 or younger, your body mass index should be between 19-25. Your Body mass index is 36.58 kg/m. If this is out of the aformentioned range listed, please consider follow up with your Primary Care Provider.   You have been scheduled for a colonoscopy. Please follow written instructions given to you at your visit today.  Please pick up your prep supplies at the pharmacy within the next 1-3 days. If you use inhalers (even only as needed), please bring them with you on the day of your procedure.  Due to recent changes in healthcare laws, you may see the results of your imaging and laboratory studies on MyChart before your provider has had a chance to review them.  We understand that in some cases there may be results that are confusing or concerning to you. Not all laboratory results come back in the same time frame and the provider may be waiting for multiple results in order to interpret others.  Please give [place] 48 hours in order for your provider to thoroughly review all the results before contacting the office for clarification of your results.   The [facility] providers would like to encourage you to use [facility] to communicate with providers for non-urgent requests or questions.  Due to long hold times on the telephone, sending your provider a message by [facility] may be a faster and more efficient way to get a response.  Please allow 48 business hours for a response.  Please remember that this is for non-urgent requests.   Thank you for choosing me and [facility].  Tye Savoy, NP

## 2021-02-02 ENCOUNTER — Other Ambulatory Visit: Payer: Self-pay

## 2021-02-02 ENCOUNTER — Encounter: Payer: Self-pay | Admitting: Gastroenterology

## 2021-02-02 ENCOUNTER — Ambulatory Visit (AMBULATORY_SURGERY_CENTER): Payer: Medicare Other | Admitting: Gastroenterology

## 2021-02-02 VITALS — BP 140/84 | HR 72 | Temp 98.2°F | Resp 21 | Ht 62.0 in | Wt 200.0 lb

## 2021-02-02 DIAGNOSIS — D128 Benign neoplasm of rectum: Secondary | ICD-10-CM

## 2021-02-02 DIAGNOSIS — R195 Other fecal abnormalities: Secondary | ICD-10-CM | POA: Diagnosis present

## 2021-02-02 DIAGNOSIS — K635 Polyp of colon: Secondary | ICD-10-CM

## 2021-02-02 DIAGNOSIS — D129 Benign neoplasm of anus and anal canal: Secondary | ICD-10-CM

## 2021-02-02 DIAGNOSIS — K621 Rectal polyp: Secondary | ICD-10-CM

## 2021-02-02 DIAGNOSIS — K5730 Diverticulosis of large intestine without perforation or abscess without bleeding: Secondary | ICD-10-CM

## 2021-02-02 DIAGNOSIS — D122 Benign neoplasm of ascending colon: Secondary | ICD-10-CM

## 2021-02-02 HISTORY — PX: COLONOSCOPY: SHX174

## 2021-02-02 MED ORDER — SODIUM CHLORIDE 0.9 % IV SOLN: 500.0000 mL | Freq: Once | INTRAVENOUS | Status: DC

## 2021-02-02 NOTE — Op Note (Signed)
[facility] Patient Name: Cheryl Contreras Procedure Date: 02/02/2021 8:54 AM MRN: [id number] Endoscopist: Milus Banister , MD Age: 70 Referring MD:  Date of Birth: 1951/05/08 Gender: Female Account #: [id number] Procedure:                Colonoscopy Indications:              Positive fecal immunochemical test Medicines:                Monitored Anesthesia Care Procedure:                Pre-Anesthesia Assessment:                           - Prior to the procedure, a History and Physical                            was performed, and patient medications and                            allergies were reviewed. The patient's tolerance of                            previous anesthesia was also reviewed. The risks                            and benefits of the procedure and the sedation                            options and risks were discussed with the patient.                            All questions were answered, and informed consent                            was obtained. Prior Anticoagulants: The patient has                            taken no previous anticoagulant or antiplatelet                            agents. ASA Grade Assessment: II - A patient with                            mild systemic disease. After reviewing the risks                            and benefits, the patient was deemed in                            satisfactory condition to undergo the procedure.                           After obtaining informed consent, the colonoscope  was passed under direct vision. Throughout the                            procedure, the patient's blood pressure, pulse, and                            oxygen saturations were monitored continuously. The                            CF [id number] was introduced through the anus                            and advanced to the the cecum, identified by                            appendiceal orifice and  ileocecal valve. The                            colonoscopy was performed without difficulty. The                            patient tolerated the procedure well. The quality                            of the bowel preparation was good. The ileocecal                            valve, appendiceal orifice, and rectum were                            photographed. Scope In: 9:10:09 AM Scope Out: 9:39:10 AM Scope Withdrawal Time: 0 hours 23 minutes 36 seconds  Total Procedure Duration: 0 hours 29 minutes 1 second  Findings:                 A 25 mm polyp was found in the ascending colon (5cm                            from IC valve). The polyp was mucous capped, heaped                            up. The polyp was removed with a saline                            injection-lift technique and then mixed cold snare,                            hot snare in piecemeal fashion. Finally, the area                            was tattooed with an injection of [place] ink. jar 1  A 8 mm polyp was found in the rectum. The polyp was                            sessile. The polyp was removed with a cold snare.                            Resection and retrieval were complete. jar 2                           Mutiple small hyperplastic appearing rectosigmoid                            polyps were also noted.                           Multiple small and large-mouthed diverticula were                            found in the left colon.                           The exam was otherwise without abnormality on                            direct and retroflexion views. Complications:            No immediate complications. Estimated blood loss:                            None. Estimated Blood Loss:     Estimated blood loss: none. Impression:               - A 25 mm polyp was found in the ascending colon                            (5cm from IC valve). The polyp was mucous capped,                             heaped up. The polyp was removed with a saline                            injection-lift technique and then mixed cold snare,                            hot snare in piecemeal fashion. Finally, the area                            was tattooed with an injection of [place] ink.                           - One 8 mm polyp in the rectum, removed with a cold                            snare. Resected and  retrieved.                           - Hyperplastic appearing rectosigmoid polyps.                           - Diverticulosis in the left colon.                           - The examination was otherwise normal on direct                            and retroflexion views. Recommendation:           - Patient has a contact number available for                            emergencies. The signs and symptoms of potential                            delayed complications were discussed with the                            patient. Return to normal activities tomorrow.                            Written discharge instructions were provided to the                            patient.                           - Resume previous diet.                           - Continue present medications.                           - Await pathology results. Likely repeat                            colonoscopy in 6 months given piecemeal technique. Milus Banister, MD 02/02/2021 9:44:42 AM This report has been signed electronically.

## 2021-02-02 NOTE — Progress Notes (Signed)
To PACU, VSS. Report to [email or web address] 

## 2021-02-02 NOTE — Progress Notes (Signed)
Called to room to assist during endoscopic procedure.  Patient ID and intended procedure confirmed with present staff. Received instructions for my participation in the procedure from the performing physician.  

## 2021-02-02 NOTE — Patient Instructions (Signed)
HANDOUTS ON POLYPS & DIVERTICULOSIS GIVEN TO YOU TODAY  AWAIT PATHOLOGY RESULTS ON POLYPS REMOVED    YOU HAD AN ENDOSCOPIC PROCEDURE TODAY AT [facility]:   Refer to the procedure report that was given to you for any specific questions about what was found during the examination.  If the procedure report does not answer your questions, please call your gastroenterologist to clarify.  If you requested that your care partner not be given the details of your procedure findings, then the procedure report has been included in a sealed envelope for you to review at your convenience later.  YOU SHOULD EXPECT: Some feelings of bloating in the abdomen. Passage of more gas than usual.  Walking can help get rid of the air that was put into your GI tract during the procedure and reduce the bloating. If you had a lower endoscopy (such as a colonoscopy or flexible sigmoidoscopy) you may notice spotting of blood in your stool or on the toilet paper. If you underwent a bowel prep for your procedure, you may not have a normal bowel movement for a few days.  Please Note:  You might notice some irritation and congestion in your nose or some drainage.  This is from the oxygen used during your procedure.  There is no need for concern and it should clear up in a day or so.  SYMPTOMS TO REPORT IMMEDIATELY:  Following lower endoscopy (colonoscopy or flexible sigmoidoscopy):  Excessive amounts of blood in the stool  Significant tenderness or worsening of abdominal pains  Swelling of the abdomen that is new, acute  Fever of 100F or higher   For urgent or emergent issues, a gastroenterologist can be reached at any hour by calling [phone]. Do not use MyChart messaging for urgent concerns.    DIET:  We do recommend a small meal at first, but then you may proceed to your regular diet.  Drink plenty of fluids but you should avoid alcoholic beverages for 24 hours.  ACTIVITY:  You should plan  to take it easy for the rest of today and you should NOT DRIVE or use heavy machinery until tomorrow (because of the sedation medicines used during the test).    FOLLOW UP: Our staff will call the number listed on your records 48-72 hours following your procedure to check on you and address any questions or concerns that you may have regarding the information given to you following your procedure. If we do not reach you, we will leave a message.  We will attempt to reach you two times.  During this call, we will ask if you have developed any symptoms of COVID 19. If you develop any symptoms (ie: fever, flu-like symptoms, shortness of breath, cough etc.) before then, please call [phone].  If you test positive for Covid 19 in the 2 weeks post procedure, please call and report this information to [place].    If any biopsies were taken you will be contacted by phone or by letter within the next 1-3 weeks.  Please call us at [phone] if you have not heard about the biopsies in 3 weeks.    SIGNATURES/CONFIDENTIALITY: You and/or your care partner have signed paperwork which will be entered into your electronic medical record.  These signatures attest to the fact that that the information above on your After Visit Summary has been reviewed and is understood.  Full responsibility of the confidentiality of this discharge information lies with you and/or  your care-partner.

## 2021-02-02 NOTE — Progress Notes (Signed)
Vital signs checked by:DT  The medical and surgical history was reviewed and verified with the patient.  

## 2021-02-02 NOTE — Progress Notes (Signed)
HPI: This is a woman with FIT + stool   ROS: complete GI ROS as described in HPI, all other review negative.  Constitutional:  No unintentional weight loss   Past Medical History:  Diagnosis Date   Cancer ([facility])    Cataract    left   Encounter for mammogram to establish baseline mammogram 07/14/2013   referral for first screening mammogram age 70yo   History of cervical cancer 06/13/1982   History of pneumonia    Miscarriage    hospitalized, remote past   Obesity    Routine gynecological examination    updated pap 07/2013 after many years of no routine f/u   Thyroid disease    Tobacco use    Vaccine refused by patient 06/13/2013   pneumococcal and shingles   Varicose veins of legs    Wears glasses     Past Surgical History:  Procedure Laterality Date   COLONOSCOPY  02/02/2021   D. [place]   SALPINGOSTOMY     WISDOM TOOTH EXTRACTION      Current Outpatient Medications  Medication Sig Dispense Refill   levothyroxine (SYNTHROID) 25 MCG tablet Take 25 mcg by mouth daily.     Current Facility-Administered Medications  Medication Dose Route Frequency Provider Last Rate Last Admin   0.9 %  sodium chloride infusion  500 mL Intravenous Once Milus Banister, MD        Allergies as of 02/02/2021 - Review Complete 02/02/2021  Allergen Reaction Noted   Shellfish allergy  07/23/2013    Family History  Problem Relation Age of Onset   Cirrhosis Brother    Colon cancer Paternal Grandmother        pt not sure   Esophageal cancer Neg Hx    Stomach cancer Neg Hx     Social History   Socioeconomic History   Marital status: Married    Spouse name: Not on file   Number of children: Not on file   Years of education: Not on file   Highest education level: Not on file  Occupational History   Occupation: Retired   Tobacco Use   Smoking status: Every Day    Packs/day: 1.00    Years: 34.00    Pack years: 34.00    Types: Cigarettes   Smokeless  tobacco: Never  Vaping Use   Vaping Use: Never used  Substance and Sexual Activity   Alcohol use: Not Currently    Alcohol/week: 42.0 standard drinks    Types: 42 Cans of beer per week   Drug use: No   Sexual activity: Not Currently  Other Topics Concern   Not on file  Social History Narrative   Widowed, husband passed 2009, lives alone, exercise on the job, works at the [employer] Determinants of Radio broadcast assistant Strain: Not on file  Food Insecurity: Not on file  Transportation Needs: Not on file  Physical Activity: Not on file  Stress: Not on file  Social Connections: Not on file  Intimate Partner Violence: Not on file     Physical Exam: BP (!) 145/86   Pulse 80   Temp 98.2 F (36.8 C)   Resp 15   Ht '5\' 2"'$  (1.575 m)   Wt 200 lb (90.7 kg)   SpO2 96%   BMI 36.58 kg/m  Constitutional: generally well-appearing Psychiatric: alert and oriented x3 Lungs: CTA bilaterally Heart: no MCR  Assessment and plan: 70 y.o. female with  FIT postive stool  Colonoscoy today  Care is appropriate for the ambulatory setting.  Owens Loffler, MD [facility] 02/02/2021, 9:02 AM

## 2021-02-04 ENCOUNTER — Telehealth: Payer: Self-pay

## 2021-02-04 NOTE — Telephone Encounter (Signed)
  Follow up Call-  Call back number 02/02/2021  Post procedure Call Back phone  # [phone]  Permission to leave phone message Yes  Some recent data might be hidden     Patient questions:  Do you have a fever, pain , or abdominal swelling? No. Pain Score  0 *  Have you tolerated food without any problems? Yes.    Have you been able to return to your normal activities? Yes.    Do you have any questions about your discharge instructions: Diet   No. Medications  No. Follow up visit  No.  Do you have questions or concerns about your Care? No.  Actions: * If pain score is 4 or above: No action needed, pain <4.

## 2021-02-05 ENCOUNTER — Encounter: Payer: Self-pay | Admitting: Gastroenterology

## 2021-06-29 ENCOUNTER — Other Ambulatory Visit: Payer: Self-pay | Admitting: Family Medicine

## 2021-06-29 DIAGNOSIS — F17200 Nicotine dependence, unspecified, uncomplicated: Secondary | ICD-10-CM

## 2021-07-22 ENCOUNTER — Ambulatory Visit
Admission: RE | Admit: 2021-07-22 | Discharge: 2021-07-22 | Disposition: A | Discharge status: Home / Self Care | Payer: Medicare Other | Source: Ambulatory Visit | Attending: Family Medicine | Admitting: Family Medicine

## 2021-07-22 DIAGNOSIS — F17200 Nicotine dependence, unspecified, uncomplicated: Secondary | ICD-10-CM

## 2021-08-23 ENCOUNTER — Encounter: Payer: Self-pay | Admitting: Gastroenterology

## 2022-01-03 ENCOUNTER — Other Ambulatory Visit: Payer: Self-pay | Admitting: Internal Medicine

## 2022-01-03 DIAGNOSIS — R911 Solitary pulmonary nodule: Secondary | ICD-10-CM

## 2022-01-31 ENCOUNTER — Ambulatory Visit
Admission: RE | Admit: 2022-01-31 | Discharge: 2022-01-31 | Disposition: A | Discharge status: Home / Self Care | Payer: Medicare Other | Source: Ambulatory Visit | Attending: Internal Medicine | Admitting: Internal Medicine

## 2022-01-31 DIAGNOSIS — R911 Solitary pulmonary nodule: Secondary | ICD-10-CM

## 2022-11-25 IMAGING — CT CT CHEST LUNG CANCER SCREENING LOW DOSE W/O CM
1 of 2 series · 10 of 20 positions shown, 13 images · non-contrast
Comparison: None.

CLINICAL DATA: Lung cancer screening. Forty-nine pack-year history.
Current asymptomatic smoker.



[ct lung segmentation data · axial · 0.62mm/px · z∈[+841,+841]mm · 10 of 324 frames shown]
[frame 1/324  mediastinal]
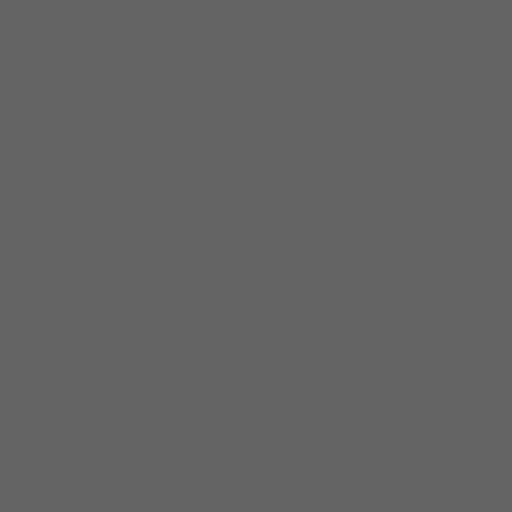
[frame 1/324  lung]
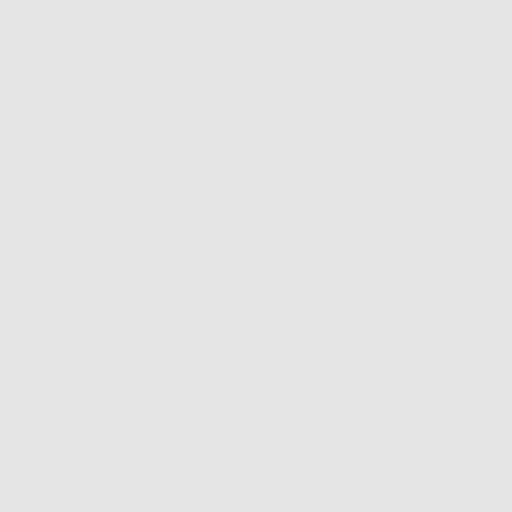
[frame 36/324  lung]
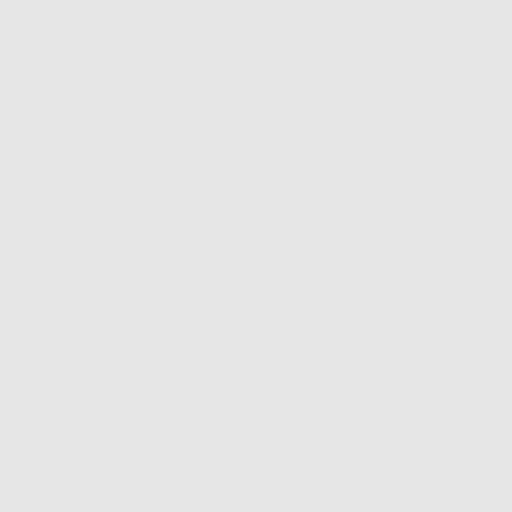
[frame 72/324  lung]
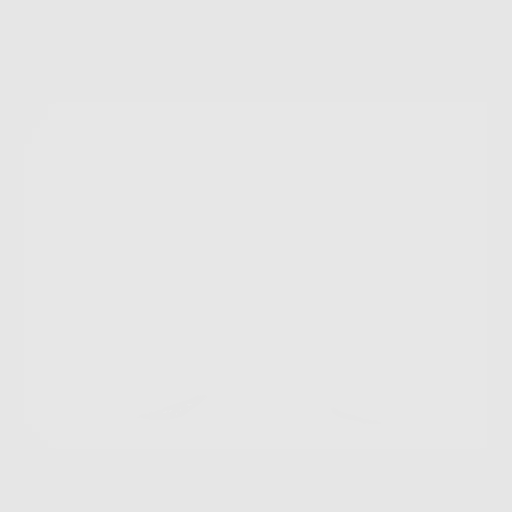
[frame 108/324  lung]
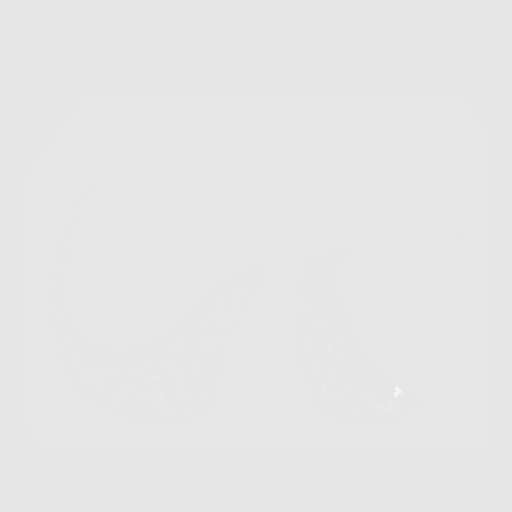
[frame 144/324  mediastinal]
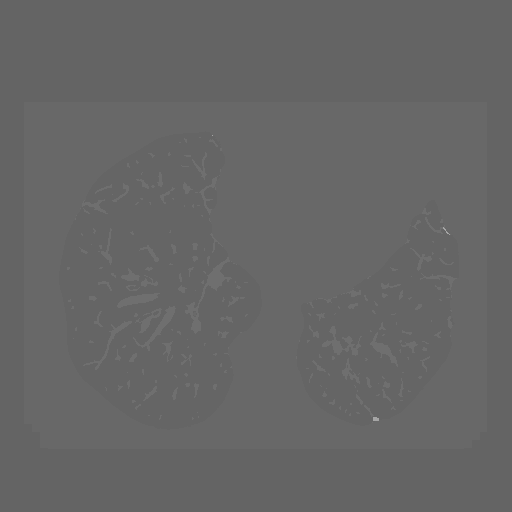
[frame 144/324  lung]
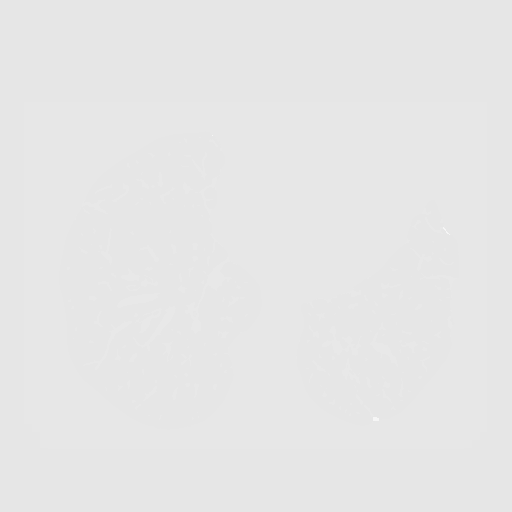
[frame 180/324  lung]
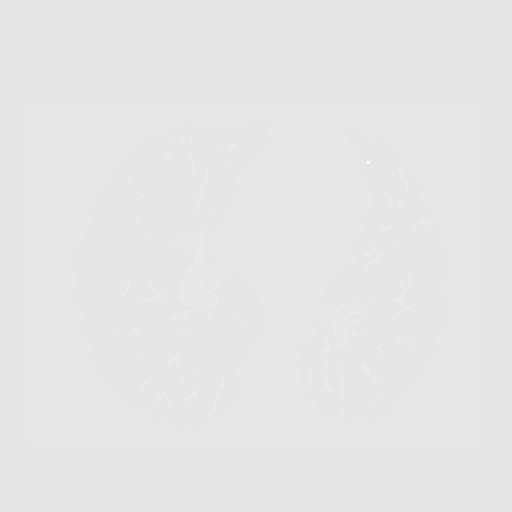
[frame 216/324  lung]
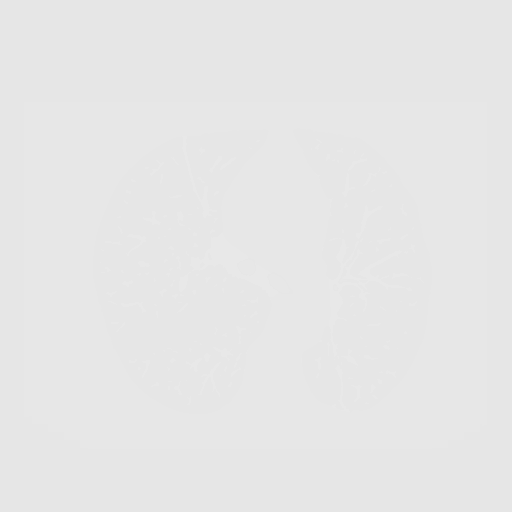
[frame 252/324  lung]
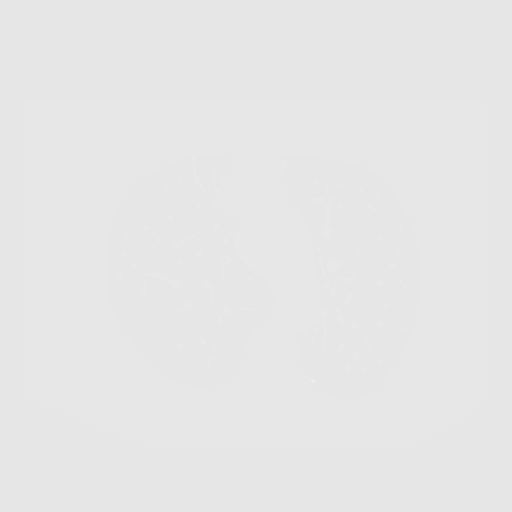
[frame 288/324  mediastinal]
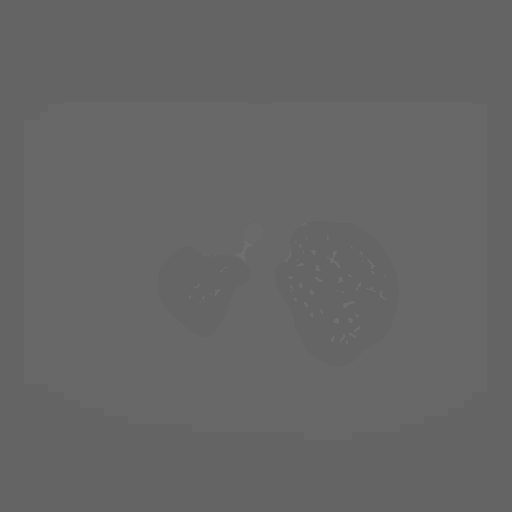
[frame 288/324  lung]
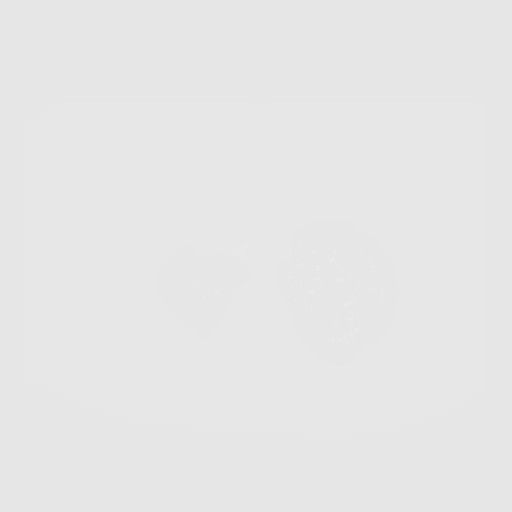
[frame 324/324  lung]
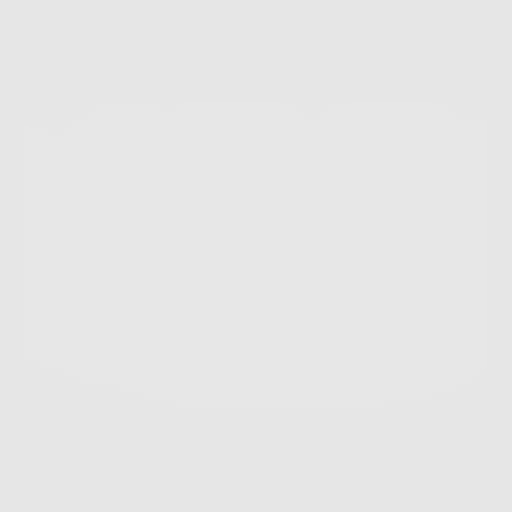

[10 of 20 positions shown; findings below may reference images not displayed]

FINDINGS: Cardiovascular: Normal heart size. Aortic atherosclerosis. Coronary
artery calcifications. No pericardial effusion.

Mediastinum/Nodes: No enlarged mediastinal, hilar, or axillary lymph
nodes. Thyroid gland, trachea, and esophagus demonstrate no
significant findings.

Lungs/Pleura: Mild centrilobular emphysema. No pleural effusion.
Scattered areas of pleuroparenchymal scarring identified
bilaterally. Two small lung nodules are identified. The largest is
in the periphery of the right lower lobe with a mean derived
diameter of 6 mm.

Upper Abdomen: No acute abnormality.

Musculoskeletal: No chest wall mass or suspicious bone lesions
identified. Thoracic degenerative disc disease identified.
IMPRESSION: 1. Lung-RADS 3, probably benign findings. Short-term follow-up in 6
months is recommended with repeat low-dose chest CT without contrast
(please use the following order, "CT CHEST LCS NODULE FOLLOW-UP W/O
CM").
2. Coronary artery calcifications.
3. Aortic Atherosclerosis (9AGHY-YXZ.Z) and Emphysema (9AGHY-QGF.T).

## 2023-04-06 ENCOUNTER — Other Ambulatory Visit: Payer: Self-pay | Admitting: Adult Health

## 2023-04-06 DIAGNOSIS — Z87891 Personal history of nicotine dependence: Secondary | ICD-10-CM

## 2023-04-06 DIAGNOSIS — Z122 Encounter for screening for malignant neoplasm of respiratory organs: Secondary | ICD-10-CM

## 2023-04-25 ENCOUNTER — Ambulatory Visit
Admission: RE | Admit: 2023-04-25 | Discharge: 2023-04-25 | Disposition: A | Discharge status: Home / Self Care | Payer: Medicare Other | Source: Ambulatory Visit | Attending: Adult Health | Admitting: Adult Health

## 2023-04-25 DIAGNOSIS — Z87891 Personal history of nicotine dependence: Secondary | ICD-10-CM

## 2023-04-25 DIAGNOSIS — Z122 Encounter for screening for malignant neoplasm of respiratory organs: Secondary | ICD-10-CM

## 2023-06-22 ENCOUNTER — Ambulatory Visit (AMBULATORY_SURGERY_CENTER): Payer: Medicare Other

## 2023-06-22 VITALS — Ht 62.0 in | Wt 210.0 lb

## 2023-06-22 DIAGNOSIS — Z860100 Personal history of colon polyps, unspecified: Secondary | ICD-10-CM

## 2023-06-22 MED ORDER — NA SULFATE-K SULFATE-MG SULF 17.5-3.13-1.6 GM/177ML PO SOLN: 1.0000 | Freq: Once | ORAL | 0 refills | Status: AC

## 2023-06-22 NOTE — Progress Notes (Signed)

## 2023-06-25 ENCOUNTER — Encounter: Payer: Self-pay | Admitting: Certified Registered Nurse Anesthetist

## 2023-07-03 ENCOUNTER — Ambulatory Visit (AMBULATORY_SURGERY_CENTER): Payer: Medicare Other | Admitting: Gastroenterology

## 2023-07-03 ENCOUNTER — Encounter: Payer: Self-pay | Admitting: Gastroenterology

## 2023-07-03 VITALS — BP 135/76 | HR 73 | Temp 97.7°F | Resp 20 | Ht 62.0 in | Wt 210.0 lb

## 2023-07-03 DIAGNOSIS — K5730 Diverticulosis of large intestine without perforation or abscess without bleeding: Secondary | ICD-10-CM | POA: Diagnosis not present

## 2023-07-03 DIAGNOSIS — K562 Volvulus: Secondary | ICD-10-CM | POA: Diagnosis not present

## 2023-07-03 DIAGNOSIS — Z860101 Personal history of adenomatous and serrated colon polyps: Secondary | ICD-10-CM | POA: Diagnosis not present

## 2023-07-03 DIAGNOSIS — Z860100 Personal history of colon polyps, unspecified: Secondary | ICD-10-CM

## 2023-07-03 DIAGNOSIS — Z1211 Encounter for screening for malignant neoplasm of colon: Secondary | ICD-10-CM

## 2023-07-03 DIAGNOSIS — K621 Rectal polyp: Secondary | ICD-10-CM

## 2023-07-03 DIAGNOSIS — Z9889 Other specified postprocedural states: Secondary | ICD-10-CM

## 2023-07-03 DIAGNOSIS — D122 Benign neoplasm of ascending colon: Secondary | ICD-10-CM

## 2023-07-03 MED ORDER — SODIUM CHLORIDE 0.9 % IV SOLN: 500.0000 mL | Freq: Once | INTRAVENOUS | Status: DC

## 2023-07-03 NOTE — Progress Notes (Signed)
GASTROENTEROLOGY PROCEDURE H&P NOTE   Primary Care Physician: Margot Ables, MD (Inactive)    Reason for Procedure:  Colon polyp surveillance  Plan:    Colonoscopy  Patient is appropriate for endoscopic procedure(s) in the ambulatory ([facility]) setting.  The nature of the procedure, as well as the risks, benefits, and alternatives were carefully and thoroughly reviewed with the patient. Ample time for discussion and questions allowed. The patient understood, was satisfied, and agreed to proceed.     HPI: Cheryl Contreras is a 73 y.o. female who presents for colonoscopy for ongoing colon polyp surveillance and colon cancer screening.  No active GI symptoms.  No known family history of colon cancer or related malignancy.  Patient is otherwise without complaints or active issues today.  Last colonoscopy was 01/2021 and notable for a 25 mm ascending colon sessile serrated polyp removed via piecemeal fashion and tattoo placed at the polypectomy site, along with rectal and rectosigmoid hyperplastic polyps, left-sided diverticulosis.  Was recommended to repeat in 6 months due to piecemeal resection.  Past Medical History:  Diagnosis Date   Cancer [facility])    Cataract    left   Encounter for mammogram to establish baseline mammogram 07/14/2013   referral for first screening mammogram age 27yo   History of cervical cancer 06/13/1982   History of pneumonia    Miscarriage    hospitalized, remote past   Obesity    Routine gynecological examination    updated pap 07/2013 after many years of no routine f/u   Thyroid disease    Tobacco use    Vaccine refused by patient 06/13/2013   pneumococcal and shingles   Varicose veins of legs    Wears glasses     Past Surgical History:  Procedure Laterality Date   COLONOSCOPY  02/02/2021   D. [facility]   COLPOSCOPY  1984   SALPINGOSTOMY     WISDOM TOOTH EXTRACTION      Prior to Admission medications   Medication Sig Start Date End Date  Taking? Authorizing Provider  gatifloxacin (ZYMAXID) 0.5 % SOLN Place 1 drop into the left eye 4 (four) times daily. 03/24/23  Yes [provider]  ketorolac (ACULAR) 0.5 % ophthalmic solution Place 1 drop into the left eye 4 (four) times daily. 03/24/23  Yes [provider]  levothyroxine (SYNTHROID) 25 MCG tablet Take 25 mcg by mouth daily. 05/11/20  Yes [provider]  prednisoLONE acetate (PRED FORTE) 1 % ophthalmic suspension Place 1 drop into the left eye 4 (four) times daily. 03/24/23  Yes [provider]    Current Outpatient Medications  Medication Sig Dispense Refill   gatifloxacin (ZYMAXID) 0.5 % SOLN Place 1 drop into the left eye 4 (four) times daily.     ketorolac (ACULAR) 0.5 % ophthalmic solution Place 1 drop into the left eye 4 (four) times daily.     levothyroxine (SYNTHROID) 25 MCG tablet Take 25 mcg by mouth daily.     prednisoLONE acetate (PRED FORTE) 1 % ophthalmic suspension Place 1 drop into the left eye 4 (four) times daily.     Current Facility-Administered Medications  Medication Dose Route Frequency Provider Last Rate Last Admin   0.9 %  sodium chloride infusion  500 mL Intravenous Once Sua Spadafora V, DO        Allergies as of 07/03/2023 - Review Complete 07/03/2023  Allergen Reaction Noted   Shellfish allergy Diarrhea and Nausea And Vomiting 07/23/2013    Family History  Problem Relation  Age of Onset   Cirrhosis Brother    Colon cancer Paternal Grandmother        pt not sure   Esophageal cancer Neg Hx    Stomach cancer Neg Hx    Rectal cancer Neg Hx     Social History   Socioeconomic History   Marital status: Married    Spouse name: Not on file   Number of children: Not on file   Years of education: Not on file   Highest education level: Not on file  Occupational History   Occupation: Retired   Tobacco Use   Smoking status: Every Day    Current packs/day: 1.00    Average packs/day: 1 pack/day for  34.0 years (34.0 ttl pk-yrs)    Types: Cigarettes   Smokeless tobacco: Never   Tobacco comments:    Smokes 1/2 pack  Vaping Use   Vaping status: Never Used  Substance and Sexual Activity   Alcohol use: Not Currently    Alcohol/week: 42.0 standard drinks of alcohol    Types: 42 Cans of beer per week   Drug use: No   Sexual activity: Not Currently  Other Topics Concern   Not on file  Social History Narrative   Widowed, husband passed 2009, lives alone, exercise on the job, works at the [employer]   Social Drivers of Corporate investment banker Strain: Not on [employer] Insecurity: Not on file  Transportation Needs: Not on file  Physical Activity: Not on file  Stress: Not on file  Social Connections: Unknown (05/09/2022)   Received from [facility], [facility]   Social Network    Social Network: Not on file  Intimate Partner Violence: Unknown (05/09/2022)   Received from [facility], [facility]   HITS    Physically Hurt: Not on file    Insult or Talk Down To: Not on file    Threaten Physical Harm: Not on file    Scream or Curse: Not on file    Physical Exam: Vital signs in last 24 hours: @BP  (!) 151/96   Pulse 84   Temp 97.7 F (36.5 C) (Temporal)   Ht 5\' 2"  (1.575 m)   Wt 210 lb (95.3 kg)   SpO2 95%   BMI 38.41 kg/m  GEN: NAD EYE: Sclerae anicteric ENT: MMM CV: Non-tachycardic Pulm: CTA b/l GI: Soft, NT/ND NEURO:  Alert & Oriented x 3   Doristine Locks, DO [facility]   07/03/2023 10:49 AM

## 2023-07-03 NOTE — Progress Notes (Signed)
1155 Pt alert and awake with sat 88 to 89 % on room air.  Albuterol neb given and encouraged to take deep breath.  Spoke with patient and she stated she get SOB at home with a lot of activity.   MD made aware.

## 2023-07-03 NOTE — Progress Notes (Signed)
1130 Albuterol neb given for sats in 88 to 90% range.  Glucagon 0.25 IV given.  MD aware.

## 2023-07-03 NOTE — Progress Notes (Signed)
Pt's states no medical or surgical changes since previsit or office visit. 

## 2023-07-03 NOTE — Patient Instructions (Addendum)
Thank you for letting us take care of your healthcare needs today. Please see handouts given to you on Polyps and Diverticulosis.    YOU HAD AN ENDOSCOPIC PROCEDURE TODAY AT [facility]:   Refer to the procedure report that was given to you for any specific questions about what was found during the examination.  If the procedure report does not answer your questions, please call your gastroenterologist to clarify.  If you requested that your care partner not be given the details of your procedure findings, then the procedure report has been included in a sealed envelope for you to review at your convenience later.  YOU SHOULD EXPECT: Some feelings of bloating in the abdomen. Passage of more gas than usual.  Walking can help get rid of the air that was put into your GI tract during the procedure and reduce the bloating. If you had a lower endoscopy (such as a colonoscopy or flexible sigmoidoscopy) you may notice spotting of blood in your stool or on the toilet paper. If you underwent a bowel prep for your procedure, you may not have a normal bowel movement for a few days.  Please Note:  You might notice some irritation and congestion in your nose or some drainage.  This is from the oxygen used during your procedure.  There is no need for concern and it should clear up in a day or so.  SYMPTOMS TO REPORT IMMEDIATELY:  Following lower endoscopy (colonoscopy or flexible sigmoidoscopy):  Excessive amounts of blood in the stool  Significant tenderness or worsening of abdominal pains  Swelling of the abdomen that is new, acute  Fever of 100F or higher   For urgent or emergent issues, a gastroenterologist can be reached at any hour by calling [phone]. Do not use MyChart messaging for urgent concerns.    DIET:  We do recommend a small meal at first, but then you may proceed to your regular diet.  Drink plenty of fluids but you should avoid alcoholic beverages for 24  hours.  ACTIVITY:  You should plan to take it easy for the rest of today and you should NOT DRIVE or use heavy machinery until tomorrow (because of the sedation medicines used during the test).    FOLLOW UP: Our staff will call the number listed on your records the next business day following your procedure.  We will call around 7:15- 8:00 am to check on you and address any questions or concerns that you may have regarding the information given to you following your procedure. If we do not reach you, we will leave a message.     If any biopsies were taken you will be contacted by phone or by letter within the next 1-3 weeks.  Please call us at [phone] if you have not heard about the biopsies in 3 weeks.    SIGNATURES/CONFIDENTIALITY: You and/or your care partner have signed paperwork which will be entered into your electronic medical record.  These signatures attest to the fact that that the information above on your After Visit Summary has been reviewed and is understood.  Full responsibility of the confidentiality of this discharge information lies with you and/or your care-partner.

## 2023-07-03 NOTE — Progress Notes (Signed)
Called to room to assist during endoscopic procedure.  Patient ID and intended procedure confirmed with present staff. Received instructions for my participation in the procedure from the performing physician.  

## 2023-07-03 NOTE — Op Note (Signed)
[facility] Patient Name: Cheryl Contreras Procedure Date: 07/03/2023 10:55 AM MRN: [id number] Endoscopist: Doristine Locks , MD, [id number] Age: 73 Referring MD:  Date of Birth: Oct 22, 1950 Gender: Female Account #: [id number] Procedure:                Colonoscopy Indications:              High risk colon cancer surveillance: Personal                            history of sessile serrated colon polyp (10 mm or                            greater in size).                           Last colonoscopy was 01/2021 and notable for a 25 mm                            ascending colon sessile serrated polyp removed via                            piecemeal fashion and tattoo placed at the                            polypectomy site, along with rectal and                            rectosigmoid hyperplastic polyps, left-sided                            diverticulosis. Was recommended to repeat in 6                            months due to piecemeal resection. Medicines:                Monitored Anesthesia Care, Glucagon, Albuterol Procedure:                Pre-Anesthesia Assessment:                           - Prior to the procedure, a History and Physical                            was performed, and patient medications and                            allergies were reviewed. The patient's tolerance of                            previous anesthesia was also reviewed. The risks                            and benefits of the procedure and the sedation  options and risks were discussed with the patient.                            All questions were answered, and informed consent                            was obtained. Prior Anticoagulants: The patient has                            taken no anticoagulant or antiplatelet agents. ASA                            Grade Assessment: III - A patient with severe                            systemic disease. After reviewing the  risks and                            benefits, the patient was deemed in satisfactory                            condition to undergo the procedure.                           After obtaining informed consent, the colonoscope                            was passed under direct vision. Throughout the                            procedure, the patient's blood pressure, pulse, and                            oxygen saturations were monitored continuously. The                            CF [id number] #[id number] was introduced through the anus                            and advanced to the the cecum, identified by                            appendiceal orifice and ileocecal valve. The                            colonoscopy was technically difficult and complex                            due to significant looping. The patient tolerated                            the procedure well. The quality of the bowel  preparation was good. The ileocecal valve,                            appendiceal orifice, and rectum were photographed. Scope In: 11:04:14 AM Scope Out: 11:48:48 AM Scope Withdrawal Time: 0 hours 37 minutes 5 seconds  Total Procedure Duration: 0 hours 44 minutes 34 seconds  Findings:                 The perianal and digital rectal examinations were                            normal.                           Two sessile polyps were found in the ascending                            colon. The polyps were 3 to 4 mm in size. These                            polyps were removed with a cold snare. Resection                            and retrieval were complete. Estimated blood loss                            was minimal.                           A tattoo was seen in the ascending colon.                           A 20 mm polyp was found in the ascending colon,                            located within the distal side of the tattoo field.                            The polyp  was sessile. The polyp was removed with a                            cold snare via piecemeal technique. Resection was                            challenging due to positioning, looping in the                            right colon, and proximity to tattoo. Resection and                            retrieval were complete. Estimated blood loss was                            minimal.  Multiple medium-mouthed and small-mouthed                            diverticula were found in the sigmoid colon.                           Multiple sessile polyps were found in the rectum.                            The polyps were small in size. These appeared                            consistent with benign rectal hyperplastic polyps                            and previously sampled.                           Retroflexion in the rectum was not performed due to                            anatomy (narrow, short rectal vault).                           The ascending colon revealed significantly                            excessive looping. Advancing the scope required                            using manual pressure and straightening and                            shortening the scope to obtain bowel loop reduction. Complications:            No immediate complications. Estimated Blood Loss:     Estimated blood loss was minimal. Impression:               - Two 3 to 4 mm polyps in the ascending colon,                            removed with a cold snare. Resected and retrieved.                           - A tattoo was seen in the ascending colon.                           - One 20 mm polyp in the ascending colon, removed                            with a cold snare. Resected and retrieved.                           - Diverticulosis in the sigmoid colon.                           -  Multiple small polyps in the rectum.                           - There was significant looping of the  colon. Recommendation:           - Patient has a contact number available for                            emergencies. The signs and symptoms of potential                            delayed complications were discussed with the                            patient. Return to normal activities tomorrow.                            Written discharge instructions were provided to the                            patient.                           - Resume previous diet.                           - Continue present medications.                           - Await pathology results.                           - Repeat colonoscopy in 6 months for surveillance                            after piecemeal polypectomy.                           - Return to GI office PRN. Doristine Locks, MD 07/03/2023 12:03:36 PM

## 2023-07-03 NOTE — Progress Notes (Signed)
Report given to PACU, vss 

## 2023-07-05 ENCOUNTER — Telehealth: Payer: Self-pay

## 2023-07-05 ENCOUNTER — Encounter: Payer: Self-pay | Admitting: Gastroenterology

## 2023-07-05 LAB — SURGICAL PATHOLOGY

## 2023-07-05 NOTE — Telephone Encounter (Signed)
  Follow up Call-     07/03/2023   10:26 AM 02/02/2021    7:57 AM  Call back number  Post procedure Call Back phone  # [phone]  Permission to leave phone message Yes Yes     Patient questions:  Do you have a fever, pain , or abdominal swelling? No. Pain Score  0 *  Have you tolerated food without any problems? Yes.    Have you been able to return to your normal activities? Yes.    Do you have any questions about your discharge instructions: Diet   No. Medications  No. Follow up visit  No.  Do you have questions or concerns about your Care? No.  Actions: * If pain score is 4 or above: No action needed, pain <4.
